# Patient Record
Sex: Female | Born: 1985 | Race: Black or African American | Hispanic: No | Smoking: Never smoker
Health system: Southern US, Community
[De-identification: ages and names within clinical notes are randomized; demographics above are authoritative.]

## PROBLEM LIST (undated history)

## (undated) DIAGNOSIS — E669 Obesity, unspecified: Secondary | ICD-10-CM

## (undated) DIAGNOSIS — D649 Anemia, unspecified: Secondary | ICD-10-CM

## (undated) HISTORY — DX: Obesity, unspecified: E66.9

## (undated) HISTORY — DX: Anemia, unspecified: D64.9

---

## 2015-06-26 ENCOUNTER — Encounter (HOSPITAL_COMMUNITY): Payer: Self-pay | Admitting: *Deleted

## 2015-06-26 ENCOUNTER — Emergency Department (HOSPITAL_COMMUNITY): Payer: Medicaid - Out of State

## 2015-06-26 ENCOUNTER — Emergency Department (HOSPITAL_COMMUNITY)
Admission: EM | Admit: 2015-06-26 | Discharge: 2015-06-26 | Disposition: A | Discharge status: Home / Self Care | Payer: Medicaid - Out of State | Attending: Emergency Medicine | Admitting: Emergency Medicine

## 2015-06-26 DIAGNOSIS — R51 Headache: Secondary | ICD-10-CM | POA: Diagnosis present

## 2015-06-26 DIAGNOSIS — R42 Dizziness and giddiness: Secondary | ICD-10-CM | POA: Insufficient documentation

## 2015-06-26 DIAGNOSIS — R519 Headache, unspecified: Secondary | ICD-10-CM

## 2015-06-26 MED ORDER — BUTALBITAL-APAP-CAFFEINE 50-325-40 MG PO TABS: 1.0000 | ORAL_TABLET | Freq: Four times a day (QID) | ORAL | Status: AC | PRN

## 2015-06-26 MED ORDER — BUTALBITAL-APAP-CAFFEINE 50-325-40 MG PO TABS
1.0000 | ORAL_TABLET | Freq: Once | ORAL | Status: AC
  Administered 2015-06-26: 1 via ORAL
  Filled 2015-06-26: qty 1

## 2015-06-26 NOTE — Discharge Instructions (Signed)

## 2015-06-26 NOTE — ED Notes (Signed)
Pt states headache x 1 week with dizziness at times. Pt denies N/V/D

## 2015-06-28 NOTE — ED Provider Notes (Signed)
CSN: [id number]     Arrival date & time 06/26/15  1546 History   First MD Initiated Contact with Patient 06/26/15 1659     Chief Complaint  Patient presents with  . Headache     (Consider location/radiation/quality/duration/timing/severity/associated sxs/prior Treatment) HPI  Lorraine Arias is a 29 y.o. female who presents to the Emergency Department complaining of intermittent, recurrent headaches.  Symptoms present for one week.  She also reports occasional dizziness with sudden movement.  She describes a throbbing sensation behind both eyes and to the right temple.  [name] states she wakes up at night with frequent headaches.  No improved with OTC analgesics. Nothing makes the symptoms better or worse.  She denies neck pain or stiffness, rash, fever, visual changes, vomiting or visual changes. Denies family hx of migraines or neurological disorders.  History reviewed. No pertinent past medical history. History reviewed. No pertinent past surgical history. No family history on file. Social History  Substance Use Topics  . Smoking status: Never Smoker   . Smokeless tobacco: None  . Alcohol Use: No   OB History    No data available     Review of Systems  Constitutional: Negative for fever, activity change and appetite change.  HENT: Negative for facial swelling and trouble swallowing.   Eyes: Negative for photophobia, pain and visual disturbance.  Respiratory: Negative for shortness of breath.   Cardiovascular: Negative for chest pain.  Gastrointestinal: Negative for nausea and vomiting.  Musculoskeletal: Negative for neck pain and neck stiffness.  Skin: Negative for rash and wound.  Neurological: Positive for dizziness and headaches. Negative for facial asymmetry, speech difficulty, weakness and numbness.  Psychiatric/Behavioral: Negative for confusion and decreased concentration.  All other systems reviewed and are negative.     Allergies  Review of patient's allergies  indicates no known allergies.  Home Medications   Prior to Admission medications   Medication Sig Start Date End Date Taking? Authorizing Provider  acetaminophen (TYLENOL) 500 MG tablet Take 1,000 mg by mouth every 6 (six) hours as needed for mild pain.   Yes Historical Provider, MD  etonogestrel (IMPLANON) 68 MG IMPL implant 1 each by Subdermal route once.   Yes Historical Provider, MD  ibuprofen (ADVIL,MOTRIN) 200 MG tablet Take 200 mg by mouth every 6 (six) hours as needed for mild pain.   Yes Historical Provider, MD  butalbital-acetaminophen-caffeine (FIORICET) 50-325-40 MG per tablet Take 1-2 tablets by mouth every 6 (six) hours as needed for headache. 06/26/15 06/25/16  Cael Worth, PA-C   BP 134/84 mmHg  Pulse 96  Temp(Src) 98.3 F (36.8 C) (Oral)  Resp 18  Ht  (1.676 m)  Wt 276 lb (125.193 kg)  BMI 44.57 kg/m2  SpO2 99% Physical Exam  Constitutional: She is oriented to person, place, and time. She appears well-developed and well-nourished. No distress.  HENT:  Head: Normocephalic and atraumatic.  Mouth/Throat: Oropharynx is clear and moist.  Eyes: EOM are normal. Pupils are equal, round, and reactive to light.  Neck: Normal range of motion and phonation normal. Neck supple. No spinous process tenderness and no muscular tenderness present. No rigidity. No Brudzinski's sign and no Kernig's sign noted.  Cardiovascular: Normal rate, regular rhythm, normal heart sounds and intact distal pulses.   No murmur heard. Pulmonary/Chest: Effort normal and breath sounds normal. No respiratory distress.  Musculoskeletal: Normal range of motion.  Neurological: She is alert and oriented to person, place, and time. She has normal strength. No cranial nerve deficit or sensory  deficit. She exhibits normal muscle tone. Coordination and gait normal. GCS eye subscore is 4. GCS verbal subscore is 5. GCS motor subscore is 6.  Reflex Scores:      Tricep reflexes are 2+ on the right side and 2+  on the left side.      Bicep reflexes are 2+ on the right side and 2+ on the left side. Skin: Skin is warm and dry.  Psychiatric: She has a normal mood and affect.  Nursing note and vitals reviewed.   ED Course  Procedures (including critical care time) Labs Review Labs Reviewed - No data to display  Imaging Review Ct Head Wo Contrast  06/26/2015   CLINICAL DATA:  29 year old female with headache and dizziness for 1 week.  EXAM: CT HEAD WITHOUT CONTRAST  TECHNIQUE: Contiguous axial images were obtained from the base of the skull through the vertex without intravenous contrast.  COMPARISON:  No priors.  FINDINGS: No acute intracranial abnormalities. Specifically, no evidence of acute intracranial hemorrhage, no definite findings of acute/subacute cerebral ischemia, no mass, mass effect, hydrocephalus or abnormal intra or extra-axial fluid collections. Visualized paranasal sinuses and mastoids are well pneumatized. No acute displaced skull fractures are identified.  IMPRESSION: *No acute intracranial abnormalities. *The appearance of the brain is normal.   Electronically Signed   By: Trudie Reed M.D.   On: 06/26/2015 18:05   I, Jetaun Colbath L., personally reviewed and evaluated these images and lab results as part of my medical decision-making.   EKG Interpretation None      MDM   Final diagnoses:  Nonintractable headache, unspecified chronicity pattern, unspecified headache type    Pt with hx of recurrent headaches.  No focal neurological deficits on exam.  She is well appearing,vitals stable.  Advised to f/u with PMD and given referral info for neurology and advised to return here for worsening sx's.  Appears stable for d/c    Pauline Aus, PA-C 06/28/15 1658  Doug Sou, MD 07/05/15 2356

## 2017-03-13 ENCOUNTER — Ambulatory Visit (INDEPENDENT_AMBULATORY_CARE_PROVIDER_SITE_OTHER): Payer: Medicaid Other | Admitting: Family Medicine

## 2017-03-13 ENCOUNTER — Encounter: Payer: Self-pay | Admitting: Family Medicine

## 2017-03-13 DIAGNOSIS — R5383 Other fatigue: Secondary | ICD-10-CM

## 2017-03-13 DIAGNOSIS — R0609 Other forms of dyspnea: Secondary | ICD-10-CM | POA: Diagnosis not present

## 2017-03-13 DIAGNOSIS — R0683 Snoring: Secondary | ICD-10-CM

## 2017-03-13 DIAGNOSIS — L680 Hirsutism: Secondary | ICD-10-CM

## 2017-03-13 DIAGNOSIS — N926 Irregular menstruation, unspecified: Secondary | ICD-10-CM

## 2017-03-13 NOTE — Patient Instructions (Signed)
Walk every day that you are able I have ordered blood testing I have ordered a lung capacity test - to look for asthma I have ordered a home sleep test  See me in one month

## 2017-03-13 NOTE — Progress Notes (Signed)
Chief Complaint  Patient presents with  . Establish Care   New patient to establish. She hasn't seen a primary care doctor in some time. She does see an OB/GYN doctor. She has not been using birth control since December, and is a little surprised that she is not pregnant. She is in a long-term relationship and would happy to have another child. She has irregular menstrual periods she has obesity. She has hirsutism. She doesn't think she's ever been told she has PCOS or any hormonal problems. She did not have difficulty getting pregnant with her son. She states that her significant other tells her that she snores and stops breathing. She is interested in a sleep study to see if she has sleep apnea. She states she is fatigued during the day. She tells me that her fianc and her friends tell her that she "breathes hard". She states she is short of breath with exertion. She's never been diagnosed as having asthma. She's never had breathing test or inhalers. She says she is up-to-date with her Pap smears. She's never had a mammogram. She does not get flu shots. Her tetanus shot was 4 years ago.   Patient Active Problem List   Diagnosis Date Noted  . Primary snoring 03/13/2017  . Fatigue 03/13/2017  . Exertional dyspnea 03/13/2017  . Morbid obesity (HCC) 03/13/2017  . Irregular menstruation 03/13/2017  . Female hirsutism 03/13/2017    Outpatient Encounter Prescriptions as of 03/13/2017  Medication Sig  . acetaminophen (TYLENOL) 500 MG tablet Take 1,000 mg by mouth every 6 (six) hours as needed for mild pain.  Marland Kitchen ibuprofen (ADVIL,MOTRIN) 200 MG tablet Take 200 mg by mouth every 6 (six) hours as needed for mild pain.  . [DISCONTINUED] etonogestrel (IMPLANON) 68 MG IMPL implant 1 each by Subdermal route once.   No facility-administered encounter medications on file as of 03/13/2017.     Past Medical History:  Diagnosis Date  . Anemia     History reviewed. No pertinent surgical  history.  Social History   Social History  . Marital status: Significant Other    Spouse name: Mitzie Na  . Number of children: 1  . Years of education: N/A   Occupational History  . unem    Social History Main Topics  . Smoking status: Never Smoker  . Smokeless tobacco: Never Used  . Alcohol use No  . Drug use: No  . Sexual activity: Yes   Other Topics Concern  . Not on file   Social History Narrative   Dating Mitzie Na for 11 years   [name] is 4   Lives with SO and son       Family History  Problem Relation Age of Onset  . Kidney disease Father   . Cancer Father     kidney cancer  . Early death Father   . Hypertension Father     Review of Systems  Constitutional: Positive for malaise/fatigue. Negative for chills, fever and weight loss.       Tired during the day  HENT: Positive for congestion. Negative for hearing loss.        Intermittent nasal congestion  Eyes: Negative for blurred vision and pain.  Respiratory: Positive for shortness of breath. Negative for cough.   Cardiovascular: Negative for chest pain and leg swelling.  Gastrointestinal: Negative for abdominal pain, constipation, diarrhea and heartburn.  Genitourinary: Negative for dysuria and frequency.       Irregular menses  Musculoskeletal: Negative for falls, joint pain  and myalgias.  Neurological: Negative for dizziness, seizures and headaches.  Psychiatric/Behavioral: Negative for depression. The patient has insomnia. The patient is not nervous/anxious.        Snores and wakes up at night    BP 128/86 (BP Location: Right Arm, Patient Position: Sitting, Cuff Size: Normal)   Pulse 84   Temp 97.9 F (36.6 C) (Temporal)   Resp 16   Ht  (1.676 m)   Wt 278 lb (126.1 kg)   LMP 11/29/2016 (Approximate)   SpO2 100%   BMI 44.87 kg/m   Physical Exam  Constitutional: She is oriented to person, place, and time. She appears well-developed and well-nourished.  Obese. Pleasant  HENT:  Head:  Normocephalic and atraumatic.  Right Ear: External ear normal.  Left Ear: External ear normal.  Mouth/Throat: Oropharynx is clear and moist.  Eyes: Conjunctivae are normal. Pupils are equal, round, and reactive to light.  Neck: Normal range of motion. Neck supple. No thyromegaly present.  Cardiovascular: Normal rate, regular rhythm and normal heart sounds.   Pulmonary/Chest: Effort normal and breath sounds normal. No respiratory distress.  Abdominal: Soft. Bowel sounds are normal.  Musculoskeletal: Normal range of motion. She exhibits no edema.  Lymphadenopathy:    She has no cervical adenopathy.  Neurological: She is alert and oriented to person, place, and time.  Gait normal  Skin: Skin is warm and dry.  Facial hair on chin and down the neck, moderate, scattered hair on chest  Psychiatric: She has a normal mood and affect. Her behavior is normal. Thought content normal.  Nursing note and vitals reviewed.   1. Primary snoring  - Home sleep test; Future  2. Other fatigue  - TSH - Home sleep test; Future  3. Exertional dyspnea  - Pulmonary Function Test; Future  4. Morbid obesity (HCC)  - CBC - COMPLETE METABOLIC PANEL WITH GFR - Hemoglobin A1c - Lipid panel - VITAMIN D 25 Hydroxy (Vit-D Deficiency, Fractures) - Urinalysis, Routine w reflex microscopic - TSH - Testosterone  5. Irregular menstruation With hirsutism, obesity, irregular menstruation I believe she might have PCO S. I'm going to order a testosterone level. I'll refer her back to her GYN for further workup. - Testosterone  6. Female hirsutism    Patient Instructions  Walk every day that you are able I have ordered blood testing I have ordered a lung capacity test - to look for asthma I have ordered a home sleep test  See me in one month   Eustace Moore, MD

## 2017-03-14 ENCOUNTER — Encounter: Payer: Self-pay | Admitting: Family Medicine

## 2017-03-14 ENCOUNTER — Telehealth: Payer: Self-pay | Admitting: Family Medicine

## 2017-03-14 LAB — URINALYSIS, ROUTINE W REFLEX MICROSCOPIC
BILIRUBIN URINE: NEGATIVE
Glucose, UA: NEGATIVE
Hgb urine dipstick: NEGATIVE
KETONES UR: NEGATIVE
Nitrite: NEGATIVE
Specific Gravity, Urine: 1.026 (ref 1.001–1.035)
pH: 6 (ref 5.0–8.0)

## 2017-03-14 LAB — CBC
HEMATOCRIT: 40.2 % (ref 35.0–45.0)
Hemoglobin: 12.6 g/dL (ref 11.7–15.5)
MCH: 27.7 pg (ref 27.0–33.0)
MCHC: 31.3 g/dL — AB (ref 32.0–36.0)
MCV: 88.4 fL (ref 80.0–100.0)
MPV: 9.1 fL (ref 7.5–12.5)
Platelets: 294 10*3/uL (ref 140–400)
RBC: 4.55 MIL/uL (ref 3.80–5.10)
RDW: 14 % (ref 11.0–15.0)
WBC: 5.2 10*3/uL (ref 3.8–10.8)

## 2017-03-14 LAB — COMPLETE METABOLIC PANEL WITH GFR
ALBUMIN: 3.7 g/dL (ref 3.6–5.1)
ALT: 39 U/L — AB (ref 6–29)
AST: 25 U/L (ref 10–30)
Alkaline Phosphatase: 43 U/L (ref 33–115)
BILIRUBIN TOTAL: 0.4 mg/dL (ref 0.2–1.2)
BUN: 7 mg/dL (ref 7–25)
CO2: 26 mmol/L (ref 20–31)
CREATININE: 0.58 mg/dL (ref 0.50–1.10)
Calcium: 8.8 mg/dL (ref 8.6–10.2)
Chloride: 103 mmol/L (ref 98–110)
GFR, Est Non African American: >89 mL/min (ref >=60)
GLUCOSE: 71 mg/dL (ref 65–99)
Potassium: 3.8 mmol/L (ref 3.5–5.3)
Sodium: 138 mmol/L (ref 135–146)
TOTAL PROTEIN: 7.6 g/dL (ref 6.1–8.1)

## 2017-03-14 LAB — HEMOGLOBIN A1C
Hgb A1c MFr Bld: 5.4 % (ref <5.7)
MEAN PLASMA GLUCOSE: 108 mg/dL

## 2017-03-14 LAB — URINALYSIS, MICROSCOPIC ONLY
CASTS: NONE SEEN [LPF]
Crystals: NONE SEEN [HPF]
Squamous Epithelial / LPF: >27 [HPF] (ref <=5)
YEAST: NONE SEEN [HPF]

## 2017-03-14 LAB — LIPID PANEL
CHOL/HDL RATIO: 3 ratio (ref <5.0)
Cholesterol: 145 mg/dL (ref <200)
HDL: 48 mg/dL — ABNORMAL LOW (ref >50)
LDL Cholesterol: 80 mg/dL (ref <100)
Triglycerides: 86 mg/dL (ref <150)
VLDL: 17 mg/dL (ref <30)

## 2017-03-14 LAB — TESTOSTERONE: Testosterone: 82 ng/dL

## 2017-03-14 LAB — VITAMIN D 25 HYDROXY (VIT D DEFICIENCY, FRACTURES): VIT D 25 HYDROXY: 8 ng/mL — AB (ref 30–100)

## 2017-03-14 LAB — TSH: TSH: 1.61 mIU/L

## 2017-03-14 NOTE — Telephone Encounter (Signed)
[facility] for an at home sleep [facility].

## 2017-03-16 ENCOUNTER — Encounter (HOSPITAL_COMMUNITY): Payer: Medicaid Other

## 2017-03-19 ENCOUNTER — Telehealth: Payer: Self-pay

## 2017-03-19 ENCOUNTER — Encounter: Payer: Self-pay | Admitting: Family Medicine

## 2017-03-30 ENCOUNTER — Ambulatory Visit (HOSPITAL_COMMUNITY)
Admission: RE | Admit: 2017-03-30 | Discharge: 2017-03-30 | Disposition: A | Discharge status: Home / Self Care | Payer: Medicaid Other | Source: Ambulatory Visit | Attending: Family Medicine | Admitting: Family Medicine

## 2017-03-30 DIAGNOSIS — J984 Other disorders of lung: Secondary | ICD-10-CM | POA: Insufficient documentation

## 2017-03-30 DIAGNOSIS — J449 Chronic obstructive pulmonary disease, unspecified: Secondary | ICD-10-CM | POA: Insufficient documentation

## 2017-03-30 DIAGNOSIS — R0609 Other forms of dyspnea: Secondary | ICD-10-CM

## 2017-03-30 LAB — PULMONARY FUNCTION TEST
DL/VA % pred: 132 %
DL/VA: 6.67 ml/min/mmHg/L
DLCO UNC % PRED: 72 %
DLCO UNC: 19.47 ml/min/mmHg
DLCO cor % pred: 72 %
DLCO cor: 19.47 ml/min/mmHg
FEF 25-75 PRE: 1.76 L/s
FEF 25-75 Post: 3.52 L/sec
FEF2575-%Change-Post: 99 %
FEF2575-%PRED-POST: 105 %
FEF2575-%Pred-Pre: 52 %
FEV1-%CHANGE-POST: 16 %
FEV1-%PRED-POST: 85 %
FEV1-%PRED-PRE: 73 %
FEV1-PRE: 2.14 L
FEV1-Post: 2.49 L
FEV1FVC-%Change-Post: 16 %
FEV1FVC-%PRED-PRE: 92 %
FEV6-%Change-Post: 0 %
FEV6-%PRED-PRE: 80 %
FEV6-%Pred-Post: 80 %
FEV6-POST: 2.74 L
FEV6-Pre: 2.73 L
FEV6FVC-%Pred-Post: 101 %
FEV6FVC-%Pred-Pre: 101 %
FVC-%Change-Post: 0 %
FVC-%Pred-Post: 79 %
FVC-%Pred-Pre: 79 %
FVC-Post: 2.74 L
FVC-Pre: 2.73 L
POST FEV6/FVC RATIO: 100 %
PRE FEV6/FVC RATIO: 100 %
Post FEV1/FVC ratio: 91 %
Pre FEV1/FVC ratio: 78 %
RV % PRED: 107 %
RV: 1.61 L
TLC % PRED: 75 %
TLC: 4.06 L

## 2017-03-30 MED ORDER — ALBUTEROL SULFATE (2.5 MG/3ML) 0.083% IN NEBU
2.5000 mg | INHALATION_SOLUTION | Freq: Once | RESPIRATORY_TRACT | Status: AC
  Administered 2017-03-30: 2.5 mg via RESPIRATORY_TRACT

## 2017-04-03 ENCOUNTER — Telehealth: Payer: Self-pay | Admitting: Family Medicine

## 2017-04-03 NOTE — Telephone Encounter (Signed)
Lorraine Arias from [facility] calling regarding the results of the Pulmonary Function Test. The results are not available yet as the test was just done on Friday, but should be available by tomorrow. If you don't see in chart by then, she would advise calling Dr. Adah PerlHawkin's office at  [phone].

## 2017-04-03 NOTE — Telephone Encounter (Signed)
Error

## 2017-04-10 ENCOUNTER — Encounter: Payer: Self-pay | Admitting: Family Medicine

## 2017-04-17 ENCOUNTER — Ambulatory Visit: Payer: Self-pay | Admitting: Family Medicine

## 2017-04-19 ENCOUNTER — Telehealth: Payer: Self-pay | Admitting: Family Medicine

## 2017-04-19 DIAGNOSIS — G4730 Sleep apnea, unspecified: Secondary | ICD-10-CM

## 2017-04-19 NOTE — Telephone Encounter (Signed)
Patient states medicaid does not cover her home sleep study. They told her to call her pcp to find out a price ? I do not have this information. Shirlee LimerickMarion may be able to find out.  Cb#: [phone]

## 2017-04-19 NOTE — Telephone Encounter (Signed)
Will medicaid pay for full sleep study?

## 2018-01-21 ENCOUNTER — Encounter: Payer: Self-pay | Admitting: Family Medicine

## 2019-09-01 ENCOUNTER — Encounter (HOSPITAL_COMMUNITY): Payer: Self-pay | Admitting: Emergency Medicine

## 2019-09-01 ENCOUNTER — Emergency Department (HOSPITAL_COMMUNITY)
Admission: EM | Admit: 2019-09-01 | Discharge: 2019-09-02 | Discharge status: Against Medical Advice | Payer: Medicaid Other | Attending: Emergency Medicine | Admitting: Emergency Medicine

## 2019-09-01 ENCOUNTER — Other Ambulatory Visit: Payer: Self-pay

## 2019-09-01 DIAGNOSIS — R110 Nausea: Secondary | ICD-10-CM | POA: Diagnosis not present

## 2019-09-01 DIAGNOSIS — Z5321 Procedure and treatment not carried out due to patient leaving prior to being seen by health care provider: Secondary | ICD-10-CM | POA: Diagnosis not present

## 2019-09-01 LAB — CBC
HCT: 43.1 % (ref 36.0–46.0)
Hemoglobin: 13.5 g/dL (ref 12.0–15.0)
MCH: 28.2 pg (ref 26.0–34.0)
MCHC: 31.3 g/dL (ref 30.0–36.0)
MCV: 90 fL (ref 80.0–100.0)
Platelets: 293 10*3/uL (ref 150–400)
RBC: 4.79 MIL/uL (ref 3.87–5.11)
RDW: 13.8 % (ref 11.5–15.5)
WBC: 5.4 10*3/uL (ref 4.0–10.5)
nRBC: 0 % (ref 0.0–0.2)

## 2019-09-01 LAB — COMPREHENSIVE METABOLIC PANEL
ALT: 38 U/L (ref 0–44)
AST: 30 U/L (ref 15–41)
Albumin: 3.6 g/dL (ref 3.5–5.0)
Alkaline Phosphatase: 34 U/L — ABNORMAL LOW (ref 38–126)
Anion gap: 12 (ref 5–15)
BUN: 7 mg/dL (ref 6–20)
CO2: 22 mmol/L (ref 22–32)
Calcium: 9.2 mg/dL (ref 8.9–10.3)
Chloride: 99 mmol/L (ref 98–111)
Creatinine, Ser: 0.57 mg/dL (ref 0.44–1.00)
GFR calc Af Amer: >60 mL/min (ref >60)
GFR calc non Af Amer: >60 mL/min (ref >60)
Glucose, Bld: 89 mg/dL (ref 70–99)
Potassium: 3.3 mmol/L — ABNORMAL LOW (ref 3.5–5.1)
Sodium: 133 mmol/L — ABNORMAL LOW (ref 135–145)
Total Bilirubin: 0.3 mg/dL (ref 0.3–1.2)
Total Protein: 8.3 g/dL — ABNORMAL HIGH (ref 6.5–8.1)

## 2019-09-01 LAB — LIPASE, BLOOD: Lipase: 20 U/L (ref 11–51)

## 2019-09-01 MED ORDER — SODIUM CHLORIDE 0.9% FLUSH: 3.0000 mL | Freq: Once | INTRAVENOUS | Status: DC

## 2019-09-01 NOTE — ED Triage Notes (Signed)
Pt c/o nausea and intermittent abd pain x 2 weeks. Pt states she seen pcp for the same.

## 2021-12-22 ENCOUNTER — Encounter: Payer: Self-pay | Admitting: *Deleted

## 2021-12-26 ENCOUNTER — Institutional Professional Consult (permissible substitution): Payer: Medicaid Other | Admitting: Neurology

## 2022-02-14 ENCOUNTER — Encounter: Payer: Self-pay | Admitting: Neurology

## 2022-02-14 ENCOUNTER — Ambulatory Visit: Payer: Medicaid Other | Admitting: Neurology

## 2022-02-14 VITALS — BP 119/79 | HR 99 | Ht 66.0 in | Wt 304.4 lb

## 2022-02-14 DIAGNOSIS — R0681 Apnea, not elsewhere classified: Secondary | ICD-10-CM | POA: Diagnosis not present

## 2022-02-14 DIAGNOSIS — R400 Somnolence: Secondary | ICD-10-CM

## 2022-02-14 DIAGNOSIS — G4719 Other hypersomnia: Secondary | ICD-10-CM | POA: Diagnosis not present

## 2022-02-14 DIAGNOSIS — R0683 Snoring: Secondary | ICD-10-CM | POA: Diagnosis not present

## 2022-02-14 DIAGNOSIS — Z6841 Body Mass Index (BMI) 40.0 and over, adult: Secondary | ICD-10-CM

## 2022-02-14 DIAGNOSIS — Z820 Family history of epilepsy and other diseases of the nervous system: Secondary | ICD-10-CM

## 2022-02-14 DIAGNOSIS — J343 Hypertrophy of nasal turbinates: Secondary | ICD-10-CM

## 2022-02-14 NOTE — Patient Instructions (Signed)

## 2022-02-14 NOTE — Progress Notes (Signed)
Subjective:  ?  ?Patient ID: Lorraine BeckwithSheena Arias is a 36 y.o. female. ? ?HPI ? ? ? ?Huston FoleySaima Donnivan Villena, MD, PhD ?[facility] ?[address], [address] ?[phone] ?[place], [place] [id number] ? ?Dear Dr. Wyline Arias,  ? ?I saw your patient, Lorraine Arias, upon your kind request in my sleep clinic today for initial consultation of her sleep disorder, in particular, concern for underlying obstructive sleep apnea.  The patient is unaccompanied today (her BF, Lorraine Arias, and kids waited in the lobby).  As you know, Ms. Lorraine PlaterDavis is a 36 year old right-handed woman with an underlying medical history of anemia, and severe obesity with a BMI of over 45, who reports snoring and excessive daytime somnolence as well as witnessed apneas per boyfriend's report.  I reviewed your office note from 09/29/2021.  Her Epworth sleepiness score is 24/24 and her fatigue severity score is 63 out of 63.  She does not currently work, she lives at home with her boyfriend and 2 sons, ages 748 and 1.  They have no pets in the household.  She tries to go to bed around 10 and rise time is around 5.  She reports in the past few months having difficulty going to sleep and staying asleep.  She has been snoring for years, weight has been fluctuating.  She has a maternal aunt who is on CPAP and her boyfriend is on a CPAP machine.  She would be willing to get tested and try a CPAP machine herself.  She feels sleepy during the day.  She has nocturia about 2-3 times per night and fairly frequent morning headaches which are occasionally throbbing, sometimes she takes over-the-counter Tylenol or Advil for these.  She is a non-smoker and does not drink any alcohol, does not drink caffeine daily, occasional tea. ? ? ?Her Past Medical History Is Significant For: ?Past Medical History:  ?Diagnosis Date  ? Anemia   ? NSVD (normal spontaneous vaginal delivery)   ? Obesity   ? ? ?Her Past Surgical History Is Significant For: ?History reviewed. No pertinent surgical  history. ? ?Her Family History Is Significant For: ?Family History  ?Problem Relation Age of Onset  ? Kidney disease Father   ? Cancer Father   ?     kidney cancer  ? Early death Father   ? Hypertension Father   ? Sleep apnea Maternal Aunt   ? ? ?Her Social History Is Significant For: ?Social History  ? ?Socioeconomic History  ? Marital status: Significant Other  ?  Spouse name: Lorraine Arias  ? Number of children: 1  ? Years of education: Not on file  ? Highest education level: Not on file  ?Occupational History  ? Occupation: unem  ?Tobacco Use  ? Smoking status: Never  ? Smokeless tobacco: Never  ?Vaping Use  ? Vaping Use: Never used  ?Substance and Sexual Activity  ? Alcohol use: No  ? Drug use: No  ? Sexual activity: Yes  ?Other Topics Concern  ? Not on file  ?Social History Narrative  ? Dating Lorraine Arias for 11 years  ? Lorraine CapriceDarian is 4  ? Lives with SO and son  ? ?Social Determinants of Health  ? ?Financial Resource Strain: Not on file  ?Food Insecurity: Not on file  ?Transportation Needs: Not on file  ?Physical Activity: Not on file  ?Stress: Not on file  ?Social Connections: Not on file  ? ? ?Her Allergies Are:  ?No Known Allergies:  ? ?Her Current Medications Are:  ?Outpatient Encounter  Medications as of 02/14/2022  ?Medication Sig  ? acetaminophen (TYLENOL) 500 MG tablet Take 1,000 mg by mouth as needed for mild pain.  ? ibuprofen (ADVIL,MOTRIN) 200 MG tablet Take 200 mg by mouth as needed for mild pain.  ? ?No facility-administered encounter medications on file as of 02/14/2022.  ?: ? ? ?Review of Systems:  ?Out of a complete 14 point review of systems, all are reviewed and negative with the exception of these symptoms as listed below: ? ?Review of Systems  ?Neurological:   ?     Pt is here  for sleep consult  Pt states she snores, headaches,fatigue,gasping for breath doing the night.Pt denies sleep study ,hypertension,CPAP at home  ? ?ESS:24 ?FSS:63  ? ?Objective:  ?Neurological Exam ? ?Physical Exam ?Physical  Examination:  ? ?Vitals:  ? 02/14/22 1505  ?BP: 119/79  ?Pulse: 99  ? ? ?General Examination: The patient is a very pleasant 36 y.o. female in no acute distress. She appears well-developed and well-nourished and well groomed.  ? ?HEENT: Normocephalic, atraumatic, pupils are equal, round and reactive to light, extraocular tracking is good without limitation to gaze excursion or nystagmus noted. Hearing is grossly intact. Face is symmetric with normal facial animation. Speech is with a slight impediment at times, no obvious hypophonia or voice tremor.  She has fairly noisy mouth breathing at times.  She has nasal congestion.  There is no hypophonia. There is no lip, neck/head, jaw or voice tremor. Neck is supple with full range of passive and active motion. There are no carotid bruits on auscultation. Oropharynx exam reveals: mild mouth dryness, good dental hygiene and marked airway crowding, due to tonsillar size of about 2+, thicker soft palate, small airway entry, Mallampati class III, wider tongue base.  Tongue protrudes centrally and palate elevates symmetrically.  Neck circumference of 17 5/8 inches.  Nasal inspection reveals significant inferior turbinate hypertrophy bilaterally.  He has septal deviation noted. ? ?Chest: Clear to auscultation without wheezing, rhonchi or crackles noted. ? ?Heart: S1+S2+0, regular and normal without murmurs, rubs or gallops noted.  ? ?Abdomen: Soft, non-tender and non-distended. ? ?Extremities: There is no pitting edema in the distal lower extremities bilaterally.  Nonpitting puffiness noted in the distal lower extremities around the ankles. ? ?Skin: Warm and dry without trophic changes noted.  ? ?Musculoskeletal: exam reveals no obvious joint deformities.  ? ?Neurologically:  ?Mental status: The patient is awake, alert and oriented in all 4 spheres. Her immediate and remote memory, attention, language skills and fund of knowledge are appropriate. There is no evidence of  aphasia, agnosia, apraxia or anomia. Speech is clear with normal prosody and enunciation. Thought process is linear. Mood is normal and affect is normal.  ?Cranial nerves II - XII are as described above under HEENT exam.  ?Motor exam: Normal bulk, strength and tone is noted. There is no obvious tremor. Fine motor skills and coordination: grossly intact.  ?Cerebellar testing: No dysmetria or intention tremor. There is no truncal or gait ataxia.  ?Sensory exam: intact to light touch in the upper and lower extremities.  ?Gait, station and balance: She stands easily. No veering to one side is noted. No leaning to one side is noted. Posture is age-appropriate and stance is narrow based. Gait shows normal stride length and normal pace. No problems turning are noted.  ? ?Assessment and Plan:  ?In summary, Jayleigh Notarianni is a very pleasant 36 y.o.-year old female with an underlying medical history of anemia, and severe  obesity with a BMI of over 45, whose history and physical exam are concerning for obstructive sleep apnea (OSA). ?I had a long chat with the patient about my findings and the diagnosis of OSA, its prognosis and treatment options. We talked about medical treatments, surgical interventions and non-pharmacological approaches. I explained in particular the risks and ramifications of untreated moderate to severe OSA, especially with respect to developing cardiovascular disease down the Road, including congestive heart failure, difficult to treat hypertension, cardiac arrhythmias, or stroke. Even type 2 diabetes has, in part, been linked to untreated OSA. Symptoms of untreated OSA include daytime sleepiness, memory problems, mood irritability and mood disorder such as depression and anxiety, lack of energy, as well as recurrent headaches, especially morning headaches. We talked about trying to maintain a healthy lifestyle in general, as well as the importance of weight control. We also talked about the importance of  good sleep hygiene.  She was strongly advised not to drive when feeling sleepy. ?I recommended the following at this time: sleep study.  I outlined the differences between a laboratory attended sleep study versus home sleep

## 2022-04-01 ENCOUNTER — Emergency Department (HOSPITAL_COMMUNITY): Payer: Medicaid Other

## 2022-04-01 ENCOUNTER — Emergency Department (HOSPITAL_COMMUNITY)
Admission: EM | Admit: 2022-04-01 | Discharge: 2022-04-01 | Disposition: A | Discharge status: Home / Self Care | Payer: Medicaid Other | Attending: Emergency Medicine | Admitting: Emergency Medicine

## 2022-04-01 ENCOUNTER — Other Ambulatory Visit: Payer: Self-pay

## 2022-04-01 ENCOUNTER — Encounter (HOSPITAL_COMMUNITY): Payer: Self-pay

## 2022-04-01 DIAGNOSIS — R600 Localized edema: Secondary | ICD-10-CM | POA: Diagnosis not present

## 2022-04-01 DIAGNOSIS — E876 Hypokalemia: Secondary | ICD-10-CM

## 2022-04-01 DIAGNOSIS — M7989 Other specified soft tissue disorders: Secondary | ICD-10-CM | POA: Diagnosis present

## 2022-04-01 LAB — BASIC METABOLIC PANEL
Anion gap: 7 (ref 5–15)
BUN: 10 mg/dL (ref 6–20)
CO2: 29 mmol/L (ref 22–32)
Calcium: 9 mg/dL (ref 8.9–10.3)
Chloride: 103 mmol/L (ref 98–111)
Creatinine, Ser: 0.62 mg/dL (ref 0.44–1.00)
GFR, Estimated: >60 mL/min (ref >60)
Glucose, Bld: 93 mg/dL (ref 70–99)
Potassium: 3.4 mmol/L — ABNORMAL LOW (ref 3.5–5.1)
Sodium: 139 mmol/L (ref 135–145)

## 2022-04-01 LAB — CBC
HCT: 39.8 % (ref 36.0–46.0)
Hemoglobin: 11.9 g/dL — ABNORMAL LOW (ref 12.0–15.0)
MCH: 26.9 pg (ref 26.0–34.0)
MCHC: 29.9 g/dL — ABNORMAL LOW (ref 30.0–36.0)
MCV: 89.8 fL (ref 80.0–100.0)
Platelets: 282 10*3/uL (ref 150–400)
RBC: 4.43 MIL/uL (ref 3.87–5.11)
RDW: 13.3 % (ref 11.5–15.5)
WBC: 6.9 10*3/uL (ref 4.0–10.5)
nRBC: 0 % (ref 0.0–0.2)

## 2022-04-01 LAB — TROPONIN I (HIGH SENSITIVITY): Troponin I (High Sensitivity): 2 ng/L (ref <18)

## 2022-04-01 MED ORDER — FUROSEMIDE 20 MG PO TABS: 20.0000 mg | ORAL_TABLET | Freq: Every day | ORAL | 0 refills | Status: AC

## 2022-04-01 MED ORDER — POTASSIUM CHLORIDE CRYS ER 20 MEQ PO TBCR: 20.0000 meq | EXTENDED_RELEASE_TABLET | Freq: Every day | ORAL | 0 refills | Status: AC

## 2022-04-01 MED ORDER — FUROSEMIDE 40 MG PO TABS
40.0000 mg | ORAL_TABLET | Freq: Once | ORAL | Status: AC
  Administered 2022-04-01: 40 mg via ORAL
  Filled 2022-04-01: qty 1

## 2022-04-01 MED ORDER — POTASSIUM CHLORIDE CRYS ER 20 MEQ PO TBCR
40.0000 meq | EXTENDED_RELEASE_TABLET | Freq: Once | ORAL | Status: AC
  Administered 2022-04-01: 40 meq via ORAL
  Filled 2022-04-01: qty 2

## 2022-04-01 NOTE — ED Provider Notes (Signed)
[facility] EMERGENCY DEPARTMENT Provider Note   CSN: [id number] Arrival date & time: 04/01/22  1910     History  Chief Complaint  Patient presents with   Leg Swelling   Chest Pain    Lorraine Arias is a 36 y.o. female.  HPI She presents for evaluation of pain and swelling in both feet for 3 weeks.  She is taking her usual diuretic, hydrochlorothiazide without relief.  She denies calf pain, shortness of breath, weakness or dizziness.  She does not use CPAP.  She has chronic snoring.  Last time she had significant swelling in her legs was when she was shortly after she delivered her most recent child who is 36 years old.    Home Medications Prior to Admission medications   Medication Sig Start Date End Date Taking? Authorizing Provider  furosemide (LASIX) 20 MG tablet Take 1 tablet (20 mg total) by mouth daily. 04/01/22  Yes Mancel Bale, MD  potassium chloride SA (KLOR-CON M) 20 MEQ tablet Take 1 tablet (20 mEq total) by mouth daily. 04/01/22  Yes Mancel Bale, MD  acetaminophen (TYLENOL) 500 MG tablet Take 1,000 mg by mouth as needed for mild pain.    [provider]  ibuprofen (ADVIL,MOTRIN) 200 MG tablet Take 200 mg by mouth as needed for mild pain.    [provider]      Allergies    Patient has no known allergies.    Review of Systems   Review of Systems  Physical Exam Updated Vital Signs BP (!) 140/92   Pulse 92   Temp 99.1 F (37.3 C) (Oral)   Resp 18   Ht 5\' 6"  (1.676 m)   Wt (!) 138.8 kg   LMP 03/13/2022 (Exact Date)   SpO2 100%   BMI 49.39 kg/m  Physical Exam Vitals and nursing note reviewed.  Constitutional:      General: She is not in acute distress.    Appearance: She is well-developed. She is obese. She is not ill-appearing or diaphoretic.  HENT:     Head: Normocephalic and atraumatic.     Right Ear: External ear normal.     Left Ear: External ear normal.     Mouth/Throat:     Mouth: Mucous membranes are moist.  Eyes:      Conjunctiva/sclera: Conjunctivae normal.     Pupils: Pupils are equal, round, and reactive to light.  Neck:     Trachea: Phonation normal.  Cardiovascular:     Rate and Rhythm: Normal rate.  Pulmonary:     Effort: Pulmonary effort is normal.  Abdominal:     General: There is no distension.     Tenderness: There is no abdominal tenderness.  Musculoskeletal:        General: Normal range of motion.     Cervical back: Normal range of motion and neck supple.     Right lower leg: Edema present.     Left lower leg: Edema present.  Skin:    General: Skin is warm and dry.  Neurological:     Mental Status: She is alert and oriented to person, place, and time.     Cranial Nerves: No cranial nerve deficit.     Sensory: No sensory deficit.     Motor: No abnormal muscle tone.     Coordination: Coordination normal.  Psychiatric:        Mood and Affect: Mood normal.        Behavior: Behavior normal.  Thought Content: Thought content normal.        Judgment: Judgment normal.    ED Results / Procedures / Treatments   Labs (all labs ordered are listed, but only abnormal results are displayed) Labs Reviewed  BASIC METABOLIC PANEL - Abnormal; Notable for the following components:      Result Value   Potassium 3.4 (*)    All other components within normal limits  CBC - Abnormal; Notable for the following components:   Hemoglobin 11.9 (*)    MCHC 29.9 (*)    All other components within normal limits  POC URINE PREG, ED  TROPONIN I (HIGH SENSITIVITY)    EKG EKG Interpretation  Date/Time:  Saturday Apr 01 2022 20:13:20 EDT Ventricular Rate:  83 PR Interval:  184 QRS Duration: 84 QT Interval:  384 QTC Calculation: 451 R Axis:   -11 Text Interpretation: Normal sinus rhythm Possible Left atrial enlargement Low voltage QRS Borderline ECG No previous ECGs available Confirmed by Mancel Bale [phone]) on 04/01/2022 8:20:19 PM  Radiology DG Chest 2 View  Result Date:  04/01/2022 CLINICAL DATA:  Chest pain. Bilateral foot swelling for 3 weeks. Chest pain tonight with progressive swelling. EXAM: CHEST - 2 VIEW COMPARISON:  Chest radiograph 03/26/2022 at [facility]. FINDINGS: Stable upper normal heart size.The cardiomediastinal contours are normal. The lungs are clear. Pulmonary vasculature is normal. No consolidation, pleural effusion, or pneumothorax. No acute osseous abnormalities are seen. IMPRESSION: Stable borderline cardiomegaly.  No acute pulmonary process. Electronically Signed   By: Narda Rutherford M.D.   On: 04/01/2022 20:24    Procedures Procedures    Medications Ordered in ED Medications  potassium chloride SA (KLOR-CON M) CR tablet 40 mEq (40 mEq Oral Given 04/01/22 2225)  furosemide (LASIX) tablet 40 mg (40 mg Oral Given 04/01/22 2225)    ED Course/ Medical Decision Making/ A&P                           Medical Decision Making Patient presenting with pain and swelling in both feet, and denies pain or swelling in the lower legs.  Gradual onset of symptoms.  Problems Addressed: Bilateral lower extremity edema: acute illness or injury Hypokalemia: acute illness or injury  Amount and/or Complexity of Data Reviewed Independent Historian:     Details: She is a lucid historian Labs: ordered.    Details: CBC, metabolic panel, troponin-normal except potassium and hemoglobin slightly low Radiology: ordered and independent interpretation performed.    Details: Chest x-ray, no infiltrate or edema  Risk Prescription drug management. Decision regarding hospitalization. Risk Details: Patient presenting for evaluation of feet swelling bilaterally.  No known trauma.  No congestive heart failure history.  Chest x-ray does not indicate congestive heart failure.  Incidental mild hypokalemia.  Patient is very obese.  Low level suspicion for DVT.  Ultrasound lower legs ordered to be done tomorrow, to evaluate for the possibility of DVT of the legs.   Meantime we will change hydrochlorothiazide to Lasix, 40 mg a day with potassium supplementation.  Instructed her to follow-up with her PCP in 1 week.  Anticipate ultrasound in the morning to rule out DVT.           Final Clinical Impression(s) / ED Diagnoses Final diagnoses:  Bilateral lower extremity edema  Hypokalemia    Rx / DC Orders ED Discharge Orders          Ordered    furosemide (LASIX) 20 MG tablet  Daily        04/01/22 2216    potassium chloride SA (KLOR-CON M) 20 MEQ tablet  Daily        04/01/22 2216    US Venous Img Lower Bilateral        04/01/22 2219              Mancel BaleWentz, Abriella Filkins, MD 04/01/22 [id number]

## 2022-04-01 NOTE — ED Triage Notes (Addendum)
Pt c/o bilateral foot swelling for x3 weeks, states she started having some intermittent chest pains tonight and the swelling is getting worse. Denies SOB.

## 2022-04-01 NOTE — Discharge Instructions (Addendum)
Return tomorrow morning for ultrasound imaging of the legs to be evaluated for blood clots.  Stop taking the hydrochlorothiazide for now.  We are putting you on a stronger diuretic, furosemide, to take until you can follow-up with your primary care doctor.  We are also giving you potassium so that level improves.  Try to eat foods which contain some potassium in them.  Return here as needed for problems.

## 2022-04-01 NOTE — ED Notes (Signed)
Patient verbalizes understanding of discharge instructions. Opportunity for questioning and answers were provided. Armband removed by staff, pt discharged from ED. Ambulated out to lobby  

## 2022-04-18 ENCOUNTER — Telehealth: Payer: Self-pay | Admitting: Neurology

## 2022-04-18 NOTE — Telephone Encounter (Signed)
I spoke with the patient on 04/13/22 to reschedule her NPSG from 03/26/22, but she informed me she does not want to do the the in lab sleep study she wants to do the HST.   I started a new case with [employer] faxed the notes. Case number [id number]. Fax # [phone].

## 2022-04-24 NOTE — Telephone Encounter (Signed)
Checked the portal it is still pending.  

## 2022-04-26 NOTE — Telephone Encounter (Signed)
HST-[facility] [employer] [facility]: [id number] (exp. 04/18/22 to 07/23/22). Patient is scheduled for 05/02/22 at 10:30 AM.

## 2022-05-02 ENCOUNTER — Ambulatory Visit: Payer: Medicaid Other | Admitting: Neurology

## 2022-05-02 DIAGNOSIS — G4734 Idiopathic sleep related nonobstructive alveolar hypoventilation: Secondary | ICD-10-CM

## 2022-05-02 DIAGNOSIS — J343 Hypertrophy of nasal turbinates: Secondary | ICD-10-CM

## 2022-05-02 DIAGNOSIS — G4719 Other hypersomnia: Secondary | ICD-10-CM

## 2022-05-02 DIAGNOSIS — R400 Somnolence: Secondary | ICD-10-CM

## 2022-05-02 DIAGNOSIS — R0683 Snoring: Secondary | ICD-10-CM

## 2022-05-02 DIAGNOSIS — R0681 Apnea, not elsewhere classified: Secondary | ICD-10-CM

## 2022-05-02 DIAGNOSIS — G4733 Obstructive sleep apnea (adult) (pediatric): Secondary | ICD-10-CM

## 2022-05-02 DIAGNOSIS — Z820 Family history of epilepsy and other diseases of the nervous system: Secondary | ICD-10-CM

## 2022-05-04 ENCOUNTER — Telehealth: Payer: Self-pay | Admitting: *Deleted

## 2022-05-04 ENCOUNTER — Telehealth: Payer: Self-pay | Admitting: Neurology

## 2022-05-04 ENCOUNTER — Encounter: Payer: Self-pay | Admitting: *Deleted

## 2022-05-04 DIAGNOSIS — G4734 Idiopathic sleep related nonobstructive alveolar hypoventilation: Secondary | ICD-10-CM

## 2022-05-04 DIAGNOSIS — G4733 Obstructive sleep apnea (adult) (pediatric): Secondary | ICD-10-CM

## 2022-05-04 NOTE — Telephone Encounter (Signed)
-----   Message from Huston Foley, MD sent at 05/04/2022  9:19 AM EDT ----- Patient referred by Dr. Wyline Mood, seen by me on 02/14/22, patient had a HST on 05/02/22.   Patient has rather severe obstructive sleep apnea, I would like to see if we can bring her in for formal titration study to optimize her treatment with a CPAP machine.  I have requested an authorization in the sleep lab will try to expedite this.  I would like for the patient to try to sleep on her sides and slightly elevated with the head of bed, about 25 to 30 degrees.  If her insurance denies a formal titration sleep study overnight, I will write for an AutoPap machine and we will try to expedite a machine for her to be used at home for sleep apnea treatment. Please advise patient that I am currently holding a sleep study spot overnight in our sleep lab for next week Tuesday, just in case.  Thanks,   Huston Foley, MD, PhD [facility])

## 2022-05-04 NOTE — Procedures (Signed)
[facility]  HOME SLEEP TEST (Watch PAT) REPORT  STUDY DATE: 05/02/2022  DOB: December 05, 1985  MRN: [id number]  ORDERING CLINICIAN: Huston Foley, MD, PhD   REFERRING CLINICIAN: Suzan Slick, MD   CLINICAL INFORMATION/HISTORY: 36 year old right-handed woman with an underlying medical history of anemia, and severe obesity with a BMI of over 45, who reports snoring and excessive daytime somnolence as well as witnessed apneas.  Epworth sleepiness score: 24/24.  BMI: 48.9 kg/m  FINDINGS:   Sleep Summary:   Total Recording Time (hours, min): 8 hours, 6 minutes  Total Sleep Time (hours, min):  6 hours, 33 minutes   Percent REM (%):    2.8%   Respiratory Indices:   Calculated pAHI (per hour):  105.5/hour         REM pAHI:    n/a       NREM pAHI: 105.5/hour  Oxygen Saturation Statistics:    Oxygen Saturation (%) Mean: 90%   Minimum oxygen saturation (%):                 67%   O2 Saturation Range (%): 67-100%    O2 Saturation (minutes) <=88%: 52 min  Pulse Rate Statistics:   Pulse Mean (bpm):    77/min    Pulse Range (32-171/min, reviewed, was brief, could be an error?)   IMPRESSION: OSA (obstructive sleep apnea), severe Nocturnal hypoxemia  RECOMMENDATION:  This home sleep test demonstrates severe obstructive sleep apnea with a total AHI of 105.5/hour and O2 nadir of 67% with significant time below or at 88% saturation of nearly 1 hour for the night.  Snoring was consistently noted in the mild range, at times moderate.  Urgent initiation of treatment with positive airway pressure is highly recommended. This will require - ideally - a full night CPAP titration study for proper treatment settings, O2 monitoring and mask fitting. For now, the patient will be advised to proceed with an autoPAP titration/trial at home.  A laboratory attended titration study can be considered in the future for optimization of his treatment and better tolerance of  therapy.  Alternative treatment options are limited secondary to the severity of the patient's sleep disordered breathing.  Concomitant aggressive weight loss is highly recommended.  Please note, that untreated obstructive sleep apnea may carry additional perioperative morbidity. Patients with significant obstructive sleep apnea should receive perioperative PAP therapy and the surgeons and particularly the anesthesiologist should be informed of the diagnosis and the severity of the sleep disordered breathing. The patient should be cautioned not to drive, work at heights, or operate dangerous or heavy equipment when tired or sleepy. Review and reiteration of good sleep hygiene measures should be pursued with any patient. Other causes of the patient's symptoms, including circadian rhythm disturbances, an underlying mood disorder, medication effect and/or an underlying medical problem cannot be ruled out based on this test. Clinical correlation is recommended. The patient and her referring provider will be notified of the test results. The patient will be seen in follow up in sleep clinic at [facility]  I certify that I have reviewed the raw data recording prior to the issuance of this report in accordance with the standards of the American Academy of Sleep Medicine (AASM).  INTERPRETING PHYSICIAN:   Huston Foley, MD, PhD  Board Certified in Neurology and Sleep Medicine  [facility] [address] [place], [place] [address] [phone]

## 2022-05-04 NOTE — Telephone Encounter (Signed)
[facility] [employer] pending uploaded notes on the portal  

## 2022-05-04 NOTE — Telephone Encounter (Signed)
Checked the status on the portal, it is still pending.  

## 2022-05-04 NOTE — Telephone Encounter (Signed)
AutoPap ordered.  Please expedite set up.

## 2022-05-11 NOTE — Telephone Encounter (Signed)
Since the patient wanted to go ahead with AutoPap treatment, we can cancel the titration study for now and we can revisit need for a proper titration study down the road.

## 2022-05-15 NOTE — Telephone Encounter (Signed)
Noted at this time I have canceled the order.

## 2022-07-24 ENCOUNTER — Encounter: Payer: Medicaid Other | Admitting: Neurology

## 2022-08-17 ENCOUNTER — Telehealth: Payer: Self-pay | Admitting: Neurology

## 2022-08-17 DIAGNOSIS — G4733 Obstructive sleep apnea (adult) (pediatric): Secondary | ICD-10-CM

## 2022-08-17 NOTE — Telephone Encounter (Signed)
Lorraine Arias called back and [facility] / Medicaid they are requesting a medical of necessity statement that patient needs use of the cpap machine.  Per [employer] pt has been compliant . Needs to fax to [phone]. We saw pt in 02-2022.  Has had appt cancelled 07-24-2022 due to lack of transportation. Has 09-27-2022 scheduled.  She is compliant per [employer].

## 2022-08-17 NOTE — Telephone Encounter (Signed)
Kristen from [employer] called stating that the pt is needing a Medical Necessity Letter for her on going cpap rental in order to extend the authorization. Please call Kristen back when available.

## 2022-08-17 NOTE — Telephone Encounter (Signed)
LMVM for Lorraine Arias that was returning call.

## 2022-08-21 ENCOUNTER — Encounter: Payer: Self-pay | Admitting: *Deleted

## 2022-08-21 NOTE — Telephone Encounter (Signed)
Letter for signature, once signed will fax to [employer] [phone].

## 2022-08-21 NOTE — Telephone Encounter (Signed)
Letter signed and fax confirmation received [employer]. (Medical necessity) then also order for increase pressure to 16cm from 14cm. (Received fax confirmation).

## 2022-08-21 NOTE — Telephone Encounter (Signed)
Please place in letter format sent to her DME company.  Lorraine Arias was evaluated by me for obstructive sleep apnea concern on 02/14/2022.  This letter is to confirm medical necessity for treatment of severe sleep apnea.  [name] has severe obstructive sleep apnea as evidenced by her home sleep test on 05/02/2022, which showed a total AHI of 105.5/hour and O2 nadir of 67% with significant time below or at 88% saturation of nearly 1 hour for the night. Urgent initiation of treatment with positive airway pressure was recommended and home AutoPap treatment was started on (date). The patient is currently on AutoPap therapy.  She is currently compliant with treatment and has a follow-up scheduled in sleep clinic on 09/27/2022.    Can you ask DME if they could increase her maximum pressure to 16 cm at this time, she has residual obstructive events, I put an order in.

## 2022-08-21 NOTE — Addendum Note (Signed)
Addended by: Star Age on: 08/21/2022 12:12 PM   Modules accepted: Orders

## 2022-09-04 ENCOUNTER — Telehealth: Payer: Self-pay | Admitting: Neurology

## 2022-09-04 NOTE — Telephone Encounter (Signed)
LVM and sent mychart msg informing pt of need to reschedule 11/15 appointment - MD out 

## 2022-09-27 ENCOUNTER — Encounter: Payer: Medicaid Other | Admitting: Neurology

## 2022-10-17 ENCOUNTER — Encounter: Payer: Medicaid Other | Admitting: Neurology

## 2022-10-25 ENCOUNTER — Telehealth: Payer: Self-pay | Admitting: *Deleted

## 2022-10-26 NOTE — Telephone Encounter (Signed)
Asking for pt to call and schedule f/u appointment.

## 2023-01-11 ENCOUNTER — Encounter: Payer: Self-pay | Admitting: Neurology

## 2023-01-11 ENCOUNTER — Ambulatory Visit: Payer: Medicaid Other | Admitting: Neurology

## 2023-01-11 VITALS — BP 144/101 | HR 87 | Ht 66.0 in | Wt 305.6 lb

## 2023-01-11 DIAGNOSIS — G4733 Obstructive sleep apnea (adult) (pediatric): Secondary | ICD-10-CM | POA: Diagnosis not present

## 2023-01-11 DIAGNOSIS — Z789 Other specified health status: Secondary | ICD-10-CM | POA: Diagnosis not present

## 2023-01-11 DIAGNOSIS — G4734 Idiopathic sleep related nonobstructive alveolar hypoventilation: Secondary | ICD-10-CM | POA: Diagnosis not present

## 2023-01-11 NOTE — Progress Notes (Signed)
Subjective:    Patient ID: Lorraine Arias is a 37 y.o. female.  HPI    Interim history:   Lorraine Arias is a 37 year old right-handed woman with an underlying medical history of anemia, and severe obesity with a BMI of over 12, who presents for follow-up suggestion of obstructive sleep apnea after interim testing and starting AutoPap therapy.  The patient is unaccompanied today.  I first met her at the request of her primary care physician on 02/14/2022, at which time she reported snoring, witnessed apneas and daytime somnolence.  She was advised to proceed with a sleep study.  She had a home sleep test on 05/02/2022 which showed an AHI of 105.5/h, O2 nadir 67% with significant time below or at 88% saturation of nearly 1 hour.  Snoring was mild to moderate.  She was advised to proceed with a CPAP titration study.  However, we mutually agreed to start her on home AutoPap therapy.  Her set up date was 05/19/2022.  She has a ResMed air sense 11 AutoSet machine.  DME company is [employer].   Today, 01/11/2023: I reviewed her AutoPap compliance data from 07/17/2022 through 09/11/2022, which is a total of 30 days, during which time she used her machine 25 days with percent use days greater than 4 hours at 70%, indicating adequate compliance, average usage of 4 hours and 47 minutes, residual AHI elevated in the moderate range at 23.4/h, 95th percentile of pressure at 13.9 cm with a range of 6 to 14 cm with EPR of 3.  Leak acceptable with the 95th percentile at 12.9 L/min.  She reports feeling stable after her respiratory infection.  She admits that she had gotten out of the habit of using her AutoPap machine late last year.  I had increased her maximum pressure to 16 cm in October 2023 and pressure was increased around 08/21/2022.  Her AHI has since then improved but she has not used her machine since late November.  She reports that she was sick and had pneumonia, she was treated with antibiotics.  She was not hospitalized.   She did not restart using her AutoPap machine.  She is motivated to continue with treatment and did notice some improvement in her daytime somnolence when she was on it.  She would be willing to restart it, she is willing to consider coming in for a formal titration study.   The patient's allergies, current medications, family history, past medical history, past social history, past surgical history and problem list were reviewed and updated as appropriate.   Previously:   02/14/22: (She) reports snoring and excessive daytime somnolence as well as witnessed apneas per boyfriend's report.  I reviewed your office note from 09/29/2021.  Her Epworth sleepiness score is 24/24 and her fatigue severity score is 63 out of 63.  She does not currently work, she lives at home with her boyfriend and 2 sons, ages 5 and 17.  They have no pets in the household.  She tries to go to bed around 10 and rise time is around 5.  She reports in the past few months having difficulty going to sleep and staying asleep.  She has been snoring for years, weight has been fluctuating.  She has a maternal aunt who is on CPAP and her boyfriend is on a CPAP machine.  She would be willing to get tested and try a CPAP machine herself.  She feels sleepy during the day.  She has nocturia about 2-3 times per night  and fairly frequent morning headaches which are occasionally throbbing, sometimes she takes over-the-counter Tylenol or Advil for these.  She is a non-smoker and does not drink any alcohol, does not drink caffeine daily, occasional tea.    Her Past Medical History Is Significant For: Past Medical History:  Diagnosis Date   Anemia    NSVD (normal spontaneous vaginal delivery)    Obesity     Her Past Surgical History Is Significant For: History reviewed. No pertinent surgical history.  Her Family History Is Significant For: Family History  Problem Relation Age of Onset   Kidney disease Father    Cancer Father        kidney  cancer   Early death Father    Hypertension Father    Sleep apnea Maternal Aunt     Her Social History Is Significant For: Social History   Socioeconomic History   Marital status: Significant Other    Spouse name: Lorraine Arias   Number of children: 1   Years of education: Not on file   Highest education level: Not on file  Occupational History   Occupation: unem  Tobacco Use   Smoking status: Never   Smokeless tobacco: Never  Vaping Use   Vaping Use: Never used  Substance and Sexual Activity   Alcohol use: No   Drug use: No   Sexual activity: Yes  Other Topics Concern   Not on file  Social History Narrative   Dating Lorraine Arias for 11 years   Lorraine Arias is 4   Lives with SO and son   Social Determinants of Health   Financial Resource Strain: Not on file  Food Insecurity: Not on file  Transportation Needs: Not on file  Physical Activity: Not on file  Stress: Not on file  Social Connections: Not on file    Her Allergies Are:  No Known Allergies:   Her Current Medications Are:  Outpatient Encounter Medications as of 01/11/2023  Medication Sig   acetaminophen (TYLENOL) 500 MG tablet Take 1,000 mg by mouth as needed for mild pain.   furosemide (LASIX) 20 MG tablet Take 1 tablet (20 mg total) by mouth daily.   ibuprofen (ADVIL,MOTRIN) 200 MG tablet Take 200 mg by mouth as needed for mild pain.   potassium chloride SA (KLOR-CON M) 20 MEQ tablet Take 1 tablet (20 mEq total) by mouth daily.   No facility-administered encounter medications on file as of 01/11/2023.  :  Review of Systems:  Out of a complete 14 point review of systems, all are reviewed and negative with the exception of these symptoms as listed below:      Review of Systems  Neurological:        Initial cpap follow up.  ESS 5,   Set up 05-19-2022.  Compliant first month.  Last 3 months no recording.     Objective:  Neurological Exam  Physical Exam Physical Examination:   Vitals:   01/11/23 1043 01/11/23  1052  BP: (!) 142/96 (!) 144/101  Pulse: 88 87    General Examination: The patient is a very pleasant 37 y.o. female in no acute distress. She appears well-developed and well-nourished and well groomed.   HEENT: Normocephalic, atraumatic, pupils are equal, round and reactive to light, extraocular tracking is good without limitation to gaze excursion or nystagmus noted. Hearing is grossly intact. Face is symmetric with normal facial animation. Speech is with a slight impediment at times, no obvious hypophonia or voice tremor.  She has fairly noisy mouth  breathing at times.  She has nasal congestion and nasal speech.  There is no hypophonia. There is no lip, neck/head, jaw or voice tremor. Neck is supple with full range of passive and active motion. There are no carotid bruits on auscultation. Oropharynx exam reveals: mild mouth dryness, good dental hygiene and marked airway crowding, tongue protrudes centrally and palate elevates symmetrically.     Chest: Clear to auscultation without wheezing, rhonchi or crackles noted.   Heart: S1+S2+0, regular and normal without murmurs, rubs or gallops noted.    Abdomen: Soft, non-tender and non-distended.   Extremities: There is no pitting edema in the distal lower extremities bilaterally.   Skin: Warm and dry without trophic changes noted.    Musculoskeletal: exam reveals no obvious joint deformities.    Neurologically:  Mental status: The patient is awake, alert and oriented in all 4 spheres. Her immediate and remote memory, attention, language skills and fund of knowledge are appropriate. There is no evidence of aphasia, agnosia, apraxia or anomia. Speech is clear with normal prosody and enunciation. Thought process is linear. Mood is normal and affect is normal.  Cranial nerves II - XII are as described above under HEENT exam.  Motor exam: Normal bulk, strength and tone is noted. There is no obvious tremor. Fine motor skills and coordination: grossly  intact.  Cerebellar testing: No dysmetria or intention tremor. There is no truncal or gait ataxia.  Sensory exam: intact to light touch in the upper and lower extremities.  Gait, station and balance: She stands easily. No veering to one side is noted. No leaning to one side is noted. Posture is age-appropriate and stance is narrow based. Gait shows normal stride length and normal pace. No problems turning are noted.    Assessment and Plan:  In summary, Lorraine Arias is a very pleasant 37 year old female with an underlying medical history of anemia, and severe obesity with a BMI of over 45, who presents for follow-up suggestion of obstructive sleep apnea after interim testing and starting AutoPap therapy.  Her home sleep test from 05/02/2022 showed severe obstructive sleep apnea with an AHI of 105.5/h, O2 nadir 67% with significant time below or at 88% saturation of nearly 1 hour.  Snoring was mild to moderate.  She was started on home AutoPap therapy on 05/19/2022.  She has a ResMed air sense 11 AutoSet machine.  DME company is [facility].  She was compliant with treatment in the beginning and has not used her machine since late November 2023 after her pneumonia.  She is motivated to continue with treatment and highly encouraged to get back on her AutoPap therapy.  We increased her pressure 40 maximum pressure of 16 cm back in early October 2023.  Her AHI improved after that.  Nevertheless, given her severe sleep apnea, some difficulty tolerating treatment, and residual AHI elevation on a fairly high pressure, I recommend that she return for a formal titration study, she may do better with a standard BiPAP machine if she does require higher pressure settings.  It will also help [place] to monitor her oxygen saturations which were quite low at times during her home sleep test.  She is agreeable to restarting home AutoPap therapy and coming in for a titration study.  We will request insurance authorization for a formal  titration study and get in touch with her afterwards.  We will plan a follow-up after testing.  I answered all her questions today and she was in agreement.  I spent 40 minutes in total face-to-face time and in reviewing records during pre-charting, more than 50% of which was spent in counseling and coordination of care, reviewing test results, reviewing medications and treatment regimen and/or in discussing or reviewing the diagnosis of OSA, the prognosis and treatment options. Pertinent laboratory and imaging test results that were available during this visit with the patient were reviewed by me and considered in my medical decision making (see chart for details).

## 2023-01-11 NOTE — Patient Instructions (Signed)
I would like for you to return to the sleep lab for a CPAP titration study, during which we will monitor your sleep and adjust your treatment with the CPAP, or consider a different machine called BiPAP. I have placed the order in your chart. The sleep lab will call you to set up your sleep study.  Please continue using your autoPAP regularly. While your insurance requires that you use PAP at least 4 hours each night on 70% of the nights, I recommend, that you not skip any nights and use it throughout the night if you can. Getting used to PAP and staying with the treatment long term does take time and patience and discipline. Untreated obstructive sleep apnea when it is moderate to severe can have an adverse impact on cardiovascular health and raise her risk for heart disease, arrhythmias, hypertension, congestive heart failure, stroke and diabetes. Untreated obstructive sleep apnea causes sleep disruption, nonrestorative sleep, and sleep deprivation. This can have an impact on your day to day functioning and cause daytime sleepiness and impairment of cognitive function, memory loss, mood disturbance, and problems focussing. Using PAP regularly can improve these symptoms.

## 2023-01-30 ENCOUNTER — Telehealth: Payer: Self-pay | Admitting: Neurology

## 2023-01-30 NOTE — Telephone Encounter (Signed)
[facility] [employer] pending uploaded notes on the portal  

## 2023-02-05 NOTE — Telephone Encounter (Signed)
Checked status on the portal it is still pending.  

## 2023-02-08 NOTE — Telephone Encounter (Signed)
[facility]- [facility] Josem KaufmannYD:[id number] (exp. 01/30/23 to 04/30/23)  Sent patient a mychart message.

## 2023-10-23 IMAGING — DX DG CHEST 2V
2 series · 2 of 2 positions shown · non-contrast
Comparison: Chest radiograph 03/26/2022 at [HOSPITAL] [facility].

CLINICAL DATA: Chest pain. Bilateral foot swelling for 3 weeks.
Chest pain tonight with progressive swelling.

EXAM:
CHEST - 2 VIEW

[chest pa]
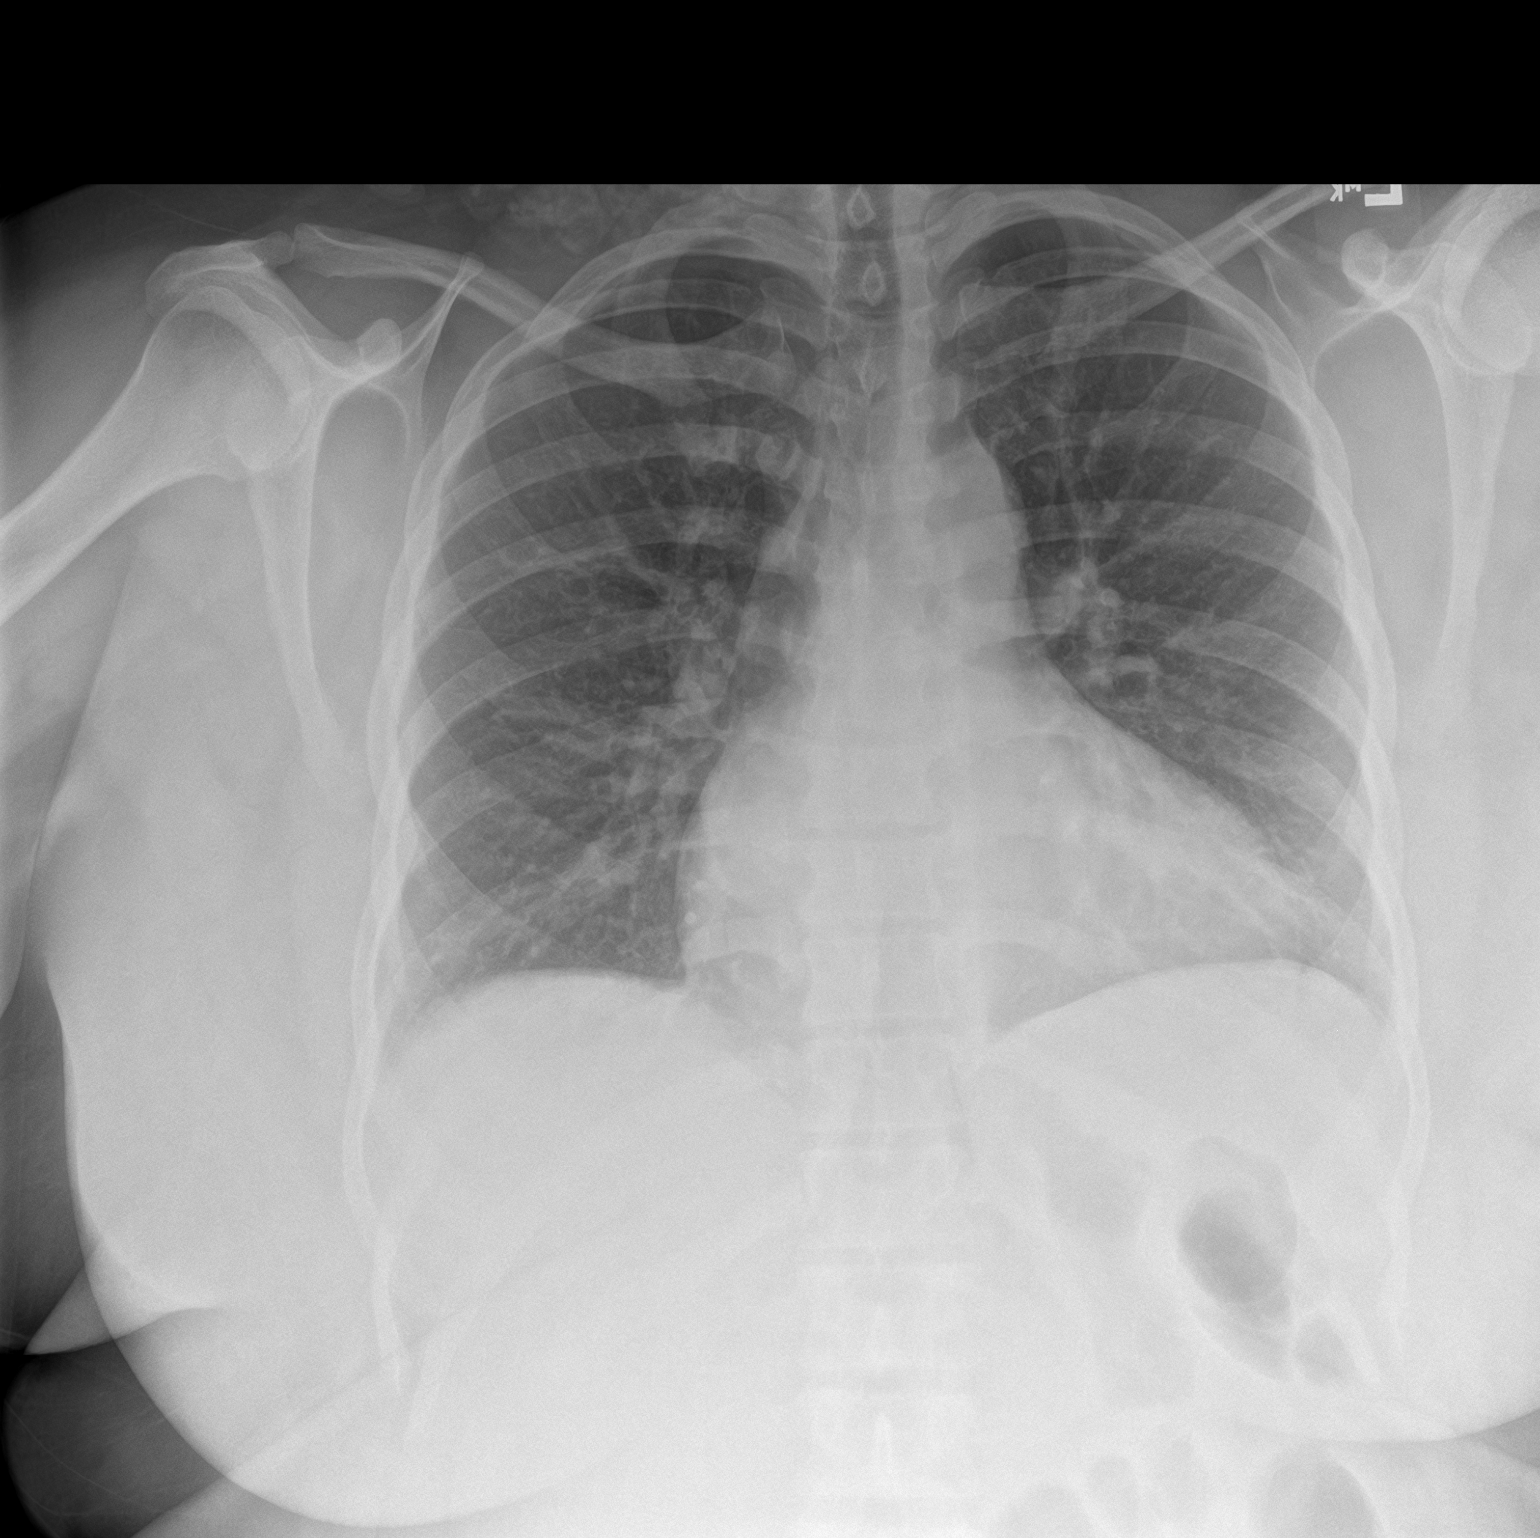

[chest lat]
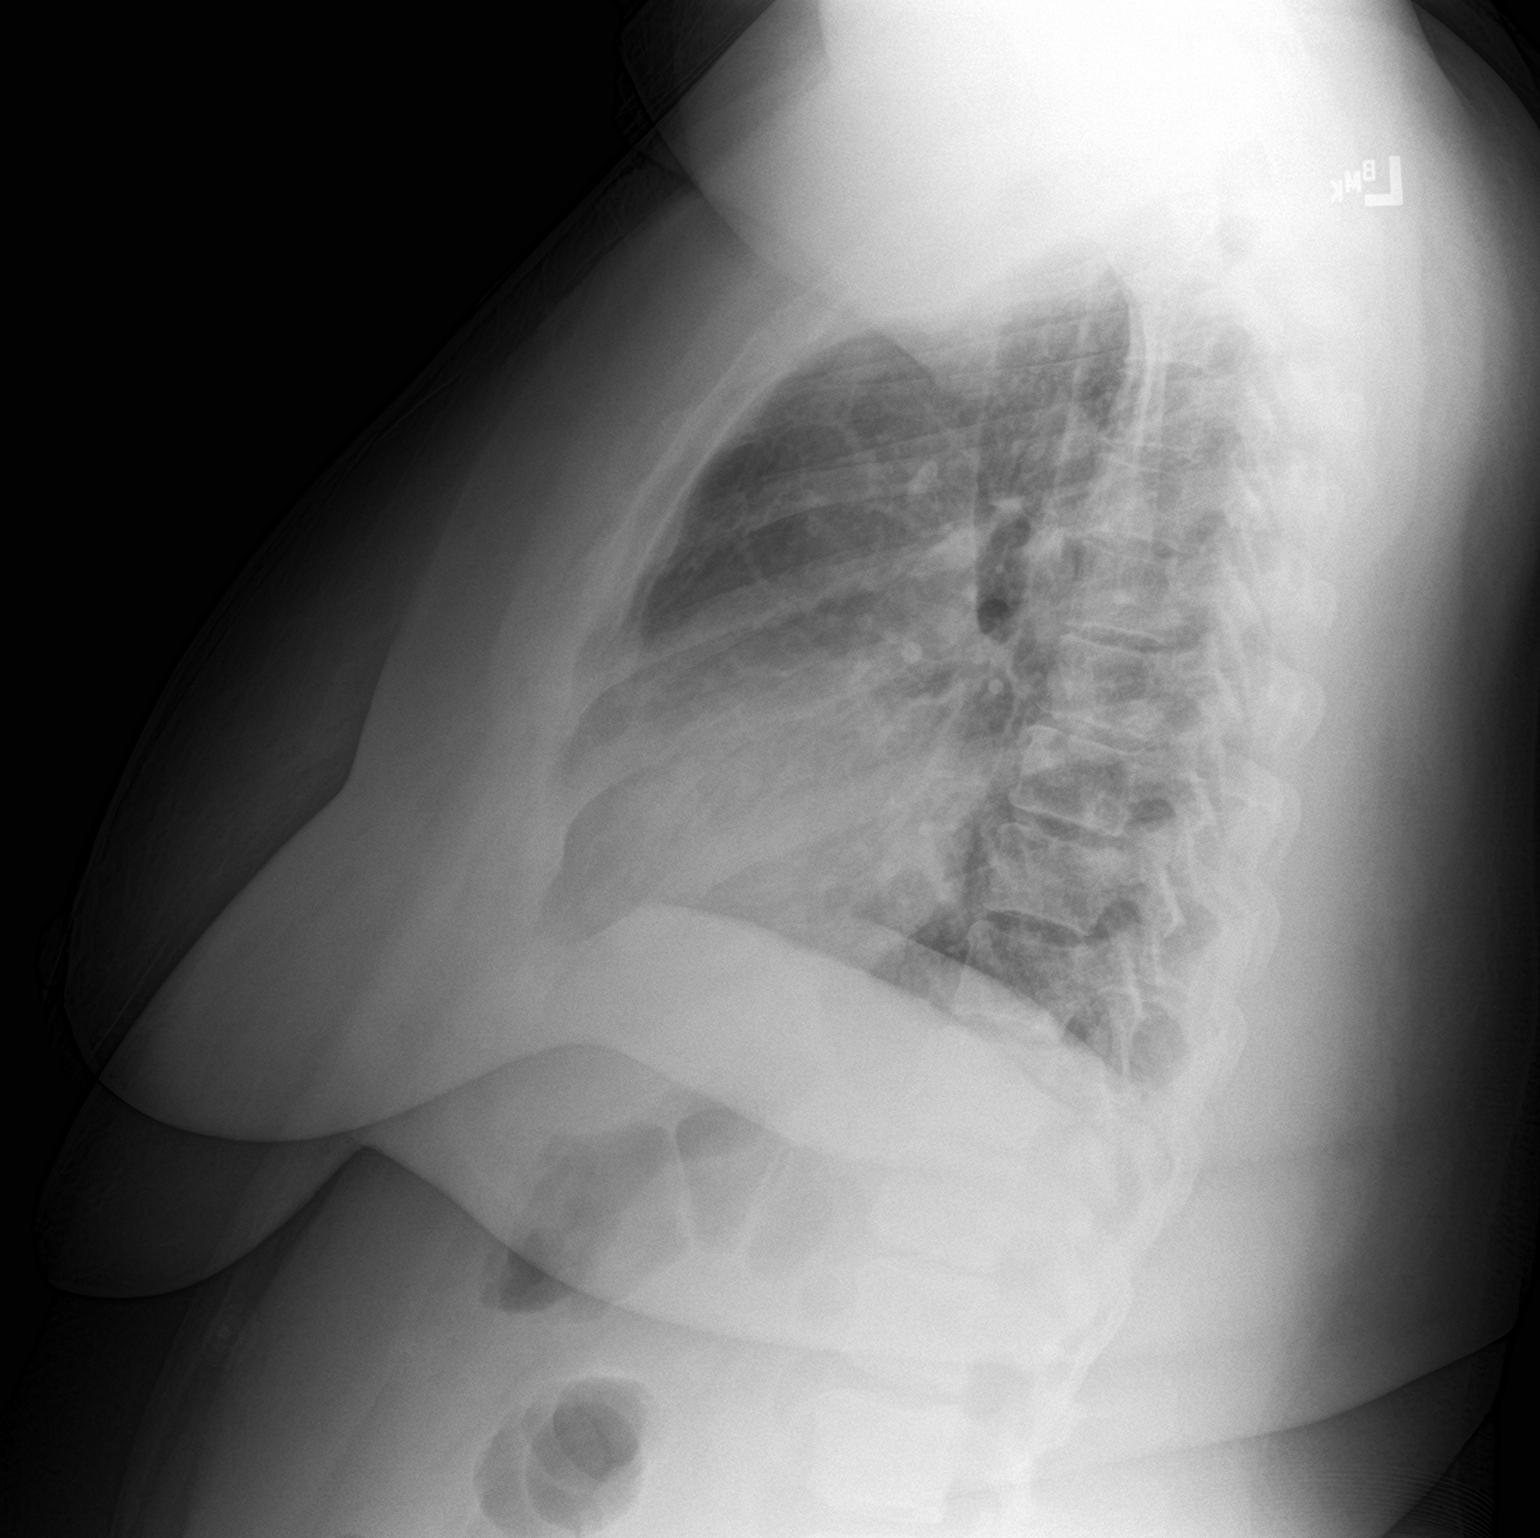

[2 of 2 positions shown; findings below may reference images not displayed]

FINDINGS: Stable upper normal heart size.The cardiomediastinal contours are
normal. The lungs are clear. Pulmonary vasculature is normal. No
consolidation, pleural effusion, or pneumothorax. No acute osseous
abnormalities are seen.
IMPRESSION: Stable borderline cardiomegaly.  No acute pulmonary process.
